# Patient Record
Sex: Female | Born: 1945 | Race: White | Hispanic: No | Marital: Single | Smoking: Never smoker
Health system: Southern US, Community
[De-identification: ages and names within clinical notes are randomized; demographics above are authoritative.]

## PROBLEM LIST (undated history)

## (undated) DIAGNOSIS — E119 Type 2 diabetes mellitus without complications: Secondary | ICD-10-CM

## (undated) DIAGNOSIS — R112 Nausea with vomiting, unspecified: Secondary | ICD-10-CM

## (undated) DIAGNOSIS — K219 Gastro-esophageal reflux disease without esophagitis: Secondary | ICD-10-CM

## (undated) DIAGNOSIS — E785 Hyperlipidemia, unspecified: Secondary | ICD-10-CM

## (undated) DIAGNOSIS — Z9889 Other specified postprocedural states: Secondary | ICD-10-CM

## (undated) DIAGNOSIS — I10 Essential (primary) hypertension: Secondary | ICD-10-CM

## (undated) HISTORY — PX: DILATION AND CURETTAGE OF UTERUS: SHX78

## (undated) HISTORY — PX: EYE SURGERY: SHX253

---

## 2013-08-24 ENCOUNTER — Other Ambulatory Visit: Payer: Self-pay | Admitting: Orthopaedic Surgery

## 2013-08-31 ENCOUNTER — Encounter (HOSPITAL_COMMUNITY): Payer: Self-pay

## 2013-09-02 ENCOUNTER — Encounter (HOSPITAL_COMMUNITY)
Admission: RE | Admit: 2013-09-02 | Discharge: 2013-09-02 | Disposition: A | Discharge status: Home / Self Care | Payer: Medicare Other | Source: Ambulatory Visit | Attending: Orthopaedic Surgery | Admitting: Orthopaedic Surgery

## 2013-09-02 ENCOUNTER — Encounter (HOSPITAL_COMMUNITY): Payer: Self-pay

## 2013-09-02 DIAGNOSIS — Z01812 Encounter for preprocedural laboratory examination: Secondary | ICD-10-CM | POA: Insufficient documentation

## 2013-09-02 DIAGNOSIS — Z01818 Encounter for other preprocedural examination: Secondary | ICD-10-CM | POA: Insufficient documentation

## 2013-09-02 HISTORY — DX: Type 2 diabetes mellitus without complications: E11.9

## 2013-09-02 HISTORY — DX: Hyperlipidemia, unspecified: E78.5

## 2013-09-02 HISTORY — DX: Other specified postprocedural states: Z98.890

## 2013-09-02 HISTORY — DX: Essential (primary) hypertension: I10

## 2013-09-02 HISTORY — DX: Gastro-esophageal reflux disease without esophagitis: K21.9

## 2013-09-02 HISTORY — DX: Nausea with vomiting, unspecified: R11.2

## 2013-09-02 LAB — URINALYSIS, ROUTINE W REFLEX MICROSCOPIC
Bilirubin Urine: NEGATIVE
Glucose, UA: NEGATIVE mg/dL
HGB URINE DIPSTICK: NEGATIVE
Ketones, ur: NEGATIVE mg/dL
Leukocytes, UA: NEGATIVE
Nitrite: NEGATIVE
Protein, ur: NEGATIVE mg/dL
SPECIFIC GRAVITY, URINE: 1.012 (ref 1.005–1.030)
Urobilinogen, UA: 0.2 mg/dL (ref 0.0–1.0)
pH: 5.5 (ref 5.0–8.0)

## 2013-09-02 LAB — CBC WITH DIFFERENTIAL/PLATELET
BASOS ABS: 0 10*3/uL (ref 0.0–0.1)
Basophils Relative: 0 % (ref 0–1)
EOS PCT: 3 % (ref 0–5)
Eosinophils Absolute: 0.2 10*3/uL (ref 0.0–0.7)
HCT: 43 % (ref 36.0–46.0)
Hemoglobin: 14.6 g/dL (ref 12.0–15.0)
LYMPHS PCT: 36 % (ref 12–46)
Lymphs Abs: 2.1 10*3/uL (ref 0.7–4.0)
MCH: 30.3 pg (ref 26.0–34.0)
MCHC: 34 g/dL (ref 30.0–36.0)
MCV: 89.2 fL (ref 78.0–100.0)
Monocytes Absolute: 0.5 10*3/uL (ref 0.1–1.0)
Monocytes Relative: 9 % (ref 3–12)
NEUTROS PCT: 51 % (ref 43–77)
Neutro Abs: 2.9 10*3/uL (ref 1.7–7.7)
PLATELETS: 238 10*3/uL (ref 150–400)
RBC: 4.82 MIL/uL (ref 3.87–5.11)
RDW: 12.1 % (ref 11.5–15.5)
WBC: 5.8 10*3/uL (ref 4.0–10.5)

## 2013-09-02 LAB — SURGICAL PCR SCREEN
MRSA, PCR: NEGATIVE
STAPHYLOCOCCUS AUREUS: NEGATIVE

## 2013-09-02 LAB — ABO/RH: ABO/RH(D): O NEG

## 2013-09-02 LAB — PROTIME-INR
INR: 0.93 (ref 0.00–1.49)
Prothrombin Time: 12.3 seconds (ref 11.6–15.2)

## 2013-09-02 LAB — BASIC METABOLIC PANEL
BUN: 23 mg/dL (ref 6–23)
CALCIUM: 10.5 mg/dL (ref 8.4–10.5)
CO2: 24 mEq/L (ref 19–32)
CREATININE: 0.89 mg/dL (ref 0.50–1.10)
Chloride: 96 mEq/L (ref 96–112)
GFR calc Af Amer: 75 mL/min — ABNORMAL LOW (ref >90)
GFR, EST NON AFRICAN AMERICAN: 65 mL/min — AB (ref >90)
Glucose, Bld: 115 mg/dL — ABNORMAL HIGH (ref 70–99)
Potassium: 3.9 mEq/L (ref 3.7–5.3)
Sodium: 136 mEq/L — ABNORMAL LOW (ref 137–147)

## 2013-09-02 LAB — TYPE AND SCREEN
ABO/RH(D): O NEG
Antibody Screen: NEGATIVE

## 2013-09-02 LAB — APTT: APTT: 29 s (ref 24–37)

## 2013-09-02 MED ORDER — CHLORHEXIDINE GLUCONATE 4 % EX LIQD: 60.0000 mL | Freq: Once | CUTANEOUS | Status: DC

## 2013-09-02 NOTE — Pre-Procedure Instructions (Signed)
[name]  09/02/2013   Your procedure is scheduled on:  Tuesday  09/07/13  [facility]  at 730 AM.  Call this number if you have problems the morning of surgery: [phone]   Remember:   Do not eat food or drink liquids after midnight.   Take these medicines the morning of surgery with A SIP OF WATER:  ZYRTEC, ESTRACE, PROVERA, ULTRAM IF NEEDED    STOP ASPIRIN   Do not wear jewelry, make-up or nail polish.  Do not wear lotions, powders, or perfumes. You may wear deodorant.  Do not shave 48 hours prior to surgery. Men may shave face and neck.  Do not bring valuables to the hospital.  [facility] not [facility]  for any belongings or valuables.               Contacts, dentures or bridgework may not be worn into surgery.  Leave suitcase in the car. After surgery it may be brought to your room.  For patients admitted to the hospital, discharge time is determined by your treatment team.               Patients discharged the day of surgery will not be allowed to drive home.  Name and phone number of your driver:   Special Instructions: Shower using CHG 2 nights before surgery and the night before surgery.  If you shower the day of surgery use CHG.  Use special wash - you have one bottle of CHG for all showers.  You should use approximately 1/3 of the bottle for each shower.   Please read over the following fact sheets that you were given: Pain Booklet, Coughing and Deep Breathing, Blood Transfusion Information and Surgical Site Infection Prevention

## 2013-09-03 NOTE — H&P (Signed)
TOTAL KNEE ADMISSION H&P  Patient is being admitted for right total knee arthroplasty.  Subjective:  Chief Complaint:right knee pain.  HPI: Rhonda Gomez, 68 y.o. female, has a history of pain and functional disability in the right knee due to arthritis and has failed non-surgical conservative treatments for greater than 12 weeks to includeNSAID's and/or analgesics, corticosteriod injections, viscosupplementation injections, flexibility and strengthening excercises, supervised PT with diminished ADL's post treatment, weight reduction as appropriate and activity modification.  Onset of symptoms was gradual, starting 10 years ago with gradually worsening course since that time. The patient noted no past surgery on the right knee(s).  Patient currently rates pain in the right knee(s) at 9 out of 10 with activity. Patient has night pain, worsening of pain with activity and weight bearing, pain that interferes with activities of daily living, pain with passive range of motion and crepitus.  Patient has evidence of subchondral sclerosis, periarticular osteophytes and joint space narrowing by imaging studies. This patient has had no previous surgery.. There is no active infection.  There are no active problems to display for this patient.  Past Medical History  Diagnosis Date  . PONV (postoperative nausea and vomiting)   . Hypertension   . Diabetes mellitus without complication   . GERD (gastroesophageal reflux disease)   . Hyperlipidemia     Past Surgical History  Procedure Laterality Date  . Dilation and curettage of uterus    . Eye surgery Bilateral     cataracts    No prescriptions prior to admission   Allergies  Allergen Reactions  . Codeine Nausea And Vomiting    History  Substance Use Topics  . Smoking status: Never Smoker   . Smokeless tobacco: Not on file  . Alcohol Use: No    No family history on file.   Review of Systems  Constitutional: Negative.   HENT: Negative.    Eyes: Negative.   Respiratory: Negative.   Cardiovascular: Negative.   Gastrointestinal: Negative.   Genitourinary: Negative.   Musculoskeletal: Positive for joint pain.  Skin: Negative.   Neurological: Negative.   Endo/Heme/Allergies: Negative.   Psychiatric/Behavioral: Negative.     Objective:  Physical Exam  Constitutional: She appears well-developed.  HENT:  Head: Normocephalic.  Eyes: Pupils are equal, round, and reactive to light.  Neck: Normal range of motion.  Cardiovascular: Normal rate.   Respiratory: Effort normal.  GI: Soft.  Musculoskeletal:  Right knee exam: No effusion.  Crepitation 1+.  Range of motion 0-100.  Medial and lateral joint line pain to palpation.  Ligaments stable.  Neurological: She is alert.  Skin: Skin is warm.  Psychiatric: She has a normal mood and affect.    Vital signs in last 24 hours:    Labs:   There is no height or weight on file to calculate BMI.   Imaging Review Plain radiographs demonstrate severe degenerative joint disease of the right knee(s). The overall alignment isneutral. The bone quality appears to be good for age and reported activity level.  Assessment/Plan:  End stage arthritis, right knee   The patient history, physical examination, clinical judgment of the provider and imaging studies are consistent with end stage degenerative joint disease of the right knee(s) and total knee arthroplasty is deemed medically necessary. The treatment options including medical management, injection therapy arthroscopy and arthroplasty were discussed at length. The risks and benefits of total knee arthroplasty were presented and reviewed. The risks due to aseptic loosening, infection, stiffness, patella tracking problems, thromboembolic  complications and other imponderables were discussed. The patient acknowledged the explanation, agreed to proceed with the plan and consent was signed. Patient is being admitted for inpatient treatment  for surgery, pain control, PT, OT, prophylactic antibiotics, VTE prophylaxis, progressive ambulation and ADL's and discharge planning. The patient is planning to be discharged to skilled nursing facility

## 2013-09-06 MED ORDER — CEFAZOLIN SODIUM-DEXTROSE 2-3 GM-% IV SOLR
2.0000 g | INTRAVENOUS | Status: AC
  Administered 2013-09-07: 2 g via INTRAVENOUS

## 2013-09-07 ENCOUNTER — Encounter (HOSPITAL_COMMUNITY): Payer: Medicare Other | Admitting: Anesthesiology

## 2013-09-07 ENCOUNTER — Inpatient Hospital Stay (HOSPITAL_COMMUNITY)
Admission: RE | Admit: 2013-09-07 | Discharge: 2013-09-09 | DRG: 470 | Disposition: A | Discharge status: Skilled Nursing Facility | Payer: Medicare Other | Source: Ambulatory Visit | Attending: Orthopaedic Surgery | Admitting: Orthopaedic Surgery

## 2013-09-07 ENCOUNTER — Inpatient Hospital Stay (HOSPITAL_COMMUNITY): Payer: Medicare Other | Admitting: Anesthesiology

## 2013-09-07 ENCOUNTER — Encounter (HOSPITAL_COMMUNITY): Payer: Self-pay | Admitting: *Deleted

## 2013-09-07 ENCOUNTER — Encounter (HOSPITAL_COMMUNITY)
Admission: RE | Disposition: A | Discharge status: Skilled Nursing Facility | Payer: Self-pay | Source: Ambulatory Visit | Attending: Orthopaedic Surgery

## 2013-09-07 DIAGNOSIS — E119 Type 2 diabetes mellitus without complications: Secondary | ICD-10-CM | POA: Diagnosis present

## 2013-09-07 DIAGNOSIS — K219 Gastro-esophageal reflux disease without esophagitis: Secondary | ICD-10-CM | POA: Diagnosis present

## 2013-09-07 DIAGNOSIS — M1710 Unilateral primary osteoarthritis, unspecified knee: Principal | ICD-10-CM | POA: Diagnosis present

## 2013-09-07 DIAGNOSIS — Z96651 Presence of right artificial knee joint: Secondary | ICD-10-CM

## 2013-09-07 DIAGNOSIS — Z79899 Other long term (current) drug therapy: Secondary | ICD-10-CM

## 2013-09-07 DIAGNOSIS — E785 Hyperlipidemia, unspecified: Secondary | ICD-10-CM | POA: Diagnosis present

## 2013-09-07 DIAGNOSIS — Z7982 Long term (current) use of aspirin: Secondary | ICD-10-CM

## 2013-09-07 DIAGNOSIS — M1711 Unilateral primary osteoarthritis, right knee: Secondary | ICD-10-CM

## 2013-09-07 DIAGNOSIS — I10 Essential (primary) hypertension: Secondary | ICD-10-CM | POA: Diagnosis present

## 2013-09-07 HISTORY — PX: TOTAL KNEE ARTHROPLASTY: SHX125

## 2013-09-07 LAB — GLUCOSE, CAPILLARY
Glucose-Capillary: 121 mg/dL — ABNORMAL HIGH (ref 70–99)
Glucose-Capillary: 101 mg/dL — ABNORMAL HIGH (ref 70–99)

## 2013-09-07 SURGERY — ARTHROPLASTY, KNEE, TOTAL
Anesthesia: Regional | Laterality: Right

## 2013-09-07 MED ORDER — PROMETHAZINE HCL 25 MG/ML IJ SOLN: 12.5000 mg | Freq: Four times a day (QID) | INTRAMUSCULAR | Status: DC | PRN

## 2013-09-07 MED ORDER — FAMOTIDINE 40 MG PO TABS
40.0000 mg | ORAL_TABLET | Freq: Every day | ORAL | Status: DC
  Administered 2013-09-07 – 2013-09-08 (×2): 40 mg via ORAL
  Filled 2013-09-07 (×4): qty 1

## 2013-09-07 MED ORDER — BUPIVACAINE LIPOSOME 1.3 % IJ SUSP
20.0000 mL | INTRAMUSCULAR | Status: AC
  Filled 2013-09-07: qty 20

## 2013-09-07 MED ORDER — HYDROMORPHONE HCL PF 1 MG/ML IJ SOLN
1.0000 mg | INTRAMUSCULAR | Status: DC | PRN
  Administered 2013-09-07: 0.5 mg via INTRAVENOUS
  Filled 2013-09-07: qty 1

## 2013-09-07 MED ORDER — PROMETHAZINE HCL 25 MG/ML IJ SOLN
12.5000 mg | Freq: Four times a day (QID) | INTRAMUSCULAR | Status: DC | PRN
  Administered 2013-09-08 – 2013-09-09 (×2): 12.5 mg via INTRAVENOUS
  Filled 2013-09-07 (×2): qty 1

## 2013-09-07 MED ORDER — ONDANSETRON HCL 4 MG PO TABS: 4.0000 mg | ORAL_TABLET | Freq: Four times a day (QID) | ORAL | Status: DC | PRN

## 2013-09-07 MED ORDER — FENTANYL CITRATE 0.05 MG/ML IJ SOLN
INTRAMUSCULAR | Status: DC | PRN
  Administered 2013-09-07 (×3): 50 ug via INTRAVENOUS

## 2013-09-07 MED ORDER — METOCLOPRAMIDE HCL 5 MG/ML IJ SOLN
5.0000 mg | Freq: Three times a day (TID) | INTRAMUSCULAR | Status: DC | PRN
  Administered 2013-09-07: 10 mg via INTRAVENOUS
  Filled 2013-09-07: qty 2

## 2013-09-07 MED ORDER — LACTATED RINGERS IV SOLN
INTRAVENOUS | Status: DC
  Administered 2013-09-07: 08:00:00 via INTRAVENOUS

## 2013-09-07 MED ORDER — OXYCODONE HCL 5 MG PO TABS
5.0000 mg | ORAL_TABLET | ORAL | Status: DC | PRN
  Filled 2013-09-07: qty 2

## 2013-09-07 MED ORDER — LORATADINE 10 MG PO TABS
10.0000 mg | ORAL_TABLET | Freq: Every day | ORAL | Status: DC
  Administered 2013-09-08 – 2013-09-09 (×2): 10 mg via ORAL
  Filled 2013-09-07 (×2): qty 1

## 2013-09-07 MED ORDER — ALUM & MAG HYDROXIDE-SIMETH 200-200-20 MG/5ML PO SUSP: 30.0000 mL | ORAL | Status: DC | PRN

## 2013-09-07 MED ORDER — LORATADINE 10 MG PO TABS: 10.0000 mg | ORAL_TABLET | Freq: Every day | ORAL | Status: DC

## 2013-09-07 MED ORDER — ACETAMINOPHEN 325 MG PO TABS
650.0000 mg | ORAL_TABLET | Freq: Four times a day (QID) | ORAL | Status: DC | PRN
  Administered 2013-09-07 – 2013-09-08 (×3): 650 mg via ORAL
  Filled 2013-09-07 (×3): qty 2

## 2013-09-07 MED ORDER — LACTATED RINGERS IV SOLN
INTRAVENOUS | Status: DC
  Administered 2013-09-07 – 2013-09-08 (×2): via INTRAVENOUS

## 2013-09-07 MED ORDER — MIDAZOLAM HCL 2 MG/2ML IJ SOLN
INTRAMUSCULAR | Status: AC
  Administered 2013-09-07 (×2): 1 mg
  Filled 2013-09-07: qty 2

## 2013-09-07 MED ORDER — FENOFIBRATE 160 MG PO TABS
160.0000 mg | ORAL_TABLET | Freq: Every day | ORAL | Status: DC
Start: 2013-09-08 — End: 2013-09-09
  Administered 2013-09-08 – 2013-09-09 (×2): 160 mg via ORAL
  Filled 2013-09-07 (×2): qty 1

## 2013-09-07 MED ORDER — SIMVASTATIN 20 MG PO TABS
20.0000 mg | ORAL_TABLET | Freq: Every day | ORAL | Status: DC
  Administered 2013-09-07 – 2013-09-08 (×2): 20 mg via ORAL
  Filled 2013-09-07 (×4): qty 1

## 2013-09-07 MED ORDER — ASPIRIN EC 325 MG PO TBEC
325.0000 mg | DELAYED_RELEASE_TABLET | Freq: Two times a day (BID) | ORAL | Status: DC
  Administered 2013-09-08 – 2013-09-09 (×4): 325 mg via ORAL
  Filled 2013-09-07 (×6): qty 1

## 2013-09-07 MED ORDER — LACTATED RINGERS IV SOLN: INTRAVENOUS | Status: DC

## 2013-09-07 MED ORDER — PHENOL 1.4 % MT LIQD: 1.0000 | OROMUCOSAL | Status: DC | PRN

## 2013-09-07 MED ORDER — PROPOFOL 10 MG/ML IV BOLUS
INTRAVENOUS | Status: DC | PRN
  Administered 2013-09-07: 40 mg via INTRAVENOUS

## 2013-09-07 MED ORDER — MENTHOL 3 MG MT LOZG: 1.0000 | LOZENGE | OROMUCOSAL | Status: DC | PRN

## 2013-09-07 MED ORDER — OXYCODONE HCL 5 MG PO TABS
5.0000 mg | ORAL_TABLET | Freq: Once | ORAL | Status: AC | PRN
  Administered 2013-09-07: 5 mg via ORAL

## 2013-09-07 MED ORDER — MEDROXYPROGESTERONE ACETATE 2.5 MG PO TABS
2.5000 mg | ORAL_TABLET | Freq: Every day | ORAL | Status: DC
  Administered 2013-09-08 – 2013-09-09 (×2): 2.5 mg via ORAL
  Filled 2013-09-07 (×2): qty 1

## 2013-09-07 MED ORDER — BUPIVACAINE IN DEXTROSE 0.75-8.25 % IT SOLN
INTRATHECAL | Status: DC | PRN
  Administered 2013-09-07: 1.8 mL via INTRATHECAL

## 2013-09-07 MED ORDER — ACETAMINOPHEN 650 MG RE SUPP: 650.0000 mg | Freq: Four times a day (QID) | RECTAL | Status: DC | PRN

## 2013-09-07 MED ORDER — LACTATED RINGERS IV SOLN
INTRAVENOUS | Status: DC | PRN
  Administered 2013-09-07 (×2): via INTRAVENOUS

## 2013-09-07 MED ORDER — PROPOFOL INFUSION 10 MG/ML OPTIME
INTRAVENOUS | Status: DC | PRN
  Administered 2013-09-07: 120 ug/kg/min via INTRAVENOUS

## 2013-09-07 MED ORDER — BUPIVACAINE LIPOSOME 1.3 % IJ SUSP
20.0000 mL | Freq: Once | INTRAMUSCULAR | Status: AC
  Administered 2013-09-07: 20 mL
  Filled 2013-09-07: qty 20

## 2013-09-07 MED ORDER — ESTRADIOL 1 MG PO TABS
0.5000 mg | ORAL_TABLET | Freq: Every day | ORAL | Status: DC
  Administered 2013-09-08 – 2013-09-09 (×2): 0.5 mg via ORAL
  Filled 2013-09-07 (×4): qty 0.5

## 2013-09-07 MED ORDER — LOSARTAN POTASSIUM 50 MG PO TABS
100.0000 mg | ORAL_TABLET | Freq: Every day | ORAL | Status: DC
  Administered 2013-09-09: 100 mg via ORAL
  Filled 2013-09-07 (×2): qty 2

## 2013-09-07 MED ORDER — ONDANSETRON HCL 4 MG/2ML IJ SOLN
4.0000 mg | Freq: Four times a day (QID) | INTRAMUSCULAR | Status: DC | PRN
  Administered 2013-09-07: 4 mg via INTRAVENOUS
  Filled 2013-09-07: qty 2

## 2013-09-07 MED ORDER — OXYCODONE HCL 5 MG/5ML PO SOLN: 5.0000 mg | Freq: Once | ORAL | Status: AC | PRN

## 2013-09-07 MED ORDER — HYDROMORPHONE HCL PF 1 MG/ML IJ SOLN
INTRAMUSCULAR | Status: AC
Start: 2013-09-07 — End: 2013-09-08
  Filled 2013-09-07: qty 1

## 2013-09-07 MED ORDER — FENTANYL CITRATE 0.05 MG/ML IJ SOLN
INTRAMUSCULAR | Status: AC
  Administered 2013-09-07 (×2): 50 ug
  Filled 2013-09-07: qty 2

## 2013-09-07 MED ORDER — SODIUM CHLORIDE 0.9 % IJ SOLN
INTRAMUSCULAR | Status: AC
  Filled 2013-09-07: qty 20

## 2013-09-07 MED ORDER — SODIUM CHLORIDE 0.9 % IV SOLN
1000.0000 mg | INTRAVENOUS | Status: AC
  Administered 2013-09-07: 1000 mg via INTRAVENOUS
  Filled 2013-09-07: qty 10

## 2013-09-07 MED ORDER — ONDANSETRON HCL 4 MG/2ML IJ SOLN
4.0000 mg | Freq: Once | INTRAMUSCULAR | Status: AC | PRN
  Administered 2013-09-07: 4 mg via INTRAVENOUS

## 2013-09-07 MED ORDER — PROMETHAZINE HCL 25 MG/ML IJ SOLN
INTRAMUSCULAR | Status: AC
  Administered 2013-09-07: 6.25 mg
  Filled 2013-09-07: qty 1

## 2013-09-07 MED ORDER — ACETAMINOPHEN 10 MG/ML IV SOLN
1000.0000 mg | INTRAVENOUS | Status: AC
  Administered 2013-09-07: 1000 mg via INTRAVENOUS
  Filled 2013-09-07: qty 100

## 2013-09-07 MED ORDER — FLUTICASONE PROPIONATE 50 MCG/ACT NA SUSP
2.0000 | Freq: Every day | NASAL | Status: DC
  Administered 2013-09-08 – 2013-09-09 (×2): 2 via NASAL
  Filled 2013-09-07: qty 16

## 2013-09-07 MED ORDER — HYDROCHLOROTHIAZIDE 25 MG PO TABS
25.0000 mg | ORAL_TABLET | Freq: Every day | ORAL | Status: DC
  Administered 2013-09-09: 25 mg via ORAL
  Filled 2013-09-07 (×2): qty 1

## 2013-09-07 MED ORDER — METOCLOPRAMIDE HCL 10 MG PO TABS: 5.0000 mg | ORAL_TABLET | Freq: Three times a day (TID) | ORAL | Status: DC | PRN

## 2013-09-07 MED ORDER — TRAMADOL HCL 50 MG PO TABS
50.0000 mg | ORAL_TABLET | Freq: Four times a day (QID) | ORAL | Status: DC | PRN
  Administered 2013-09-07 – 2013-09-09 (×6): 100 mg via ORAL
  Filled 2013-09-07 (×7): qty 2

## 2013-09-07 MED ORDER — METFORMIN HCL 500 MG PO TABS
500.0000 mg | ORAL_TABLET | Freq: Two times a day (BID) | ORAL | Status: DC
  Administered 2013-09-08 – 2013-09-09 (×4): 500 mg via ORAL
  Filled 2013-09-07 (×6): qty 1

## 2013-09-07 MED ORDER — MORPHINE SULFATE 2 MG/ML IJ SOLN
1.0000 mg | INTRAMUSCULAR | Status: DC | PRN
  Administered 2013-09-08: 2 mg via INTRAVENOUS
  Administered 2013-09-08: 1 mg via INTRAVENOUS
  Administered 2013-09-08: 2 mg via INTRAVENOUS
  Filled 2013-09-07 (×3): qty 1

## 2013-09-07 MED ORDER — OXYCODONE HCL 5 MG PO TABS
ORAL_TABLET | ORAL | Status: AC
  Filled 2013-09-07: qty 1

## 2013-09-07 MED ORDER — ONDANSETRON HCL 4 MG/2ML IJ SOLN
INTRAMUSCULAR | Status: AC
  Filled 2013-09-07: qty 2

## 2013-09-07 MED ORDER — SODIUM CHLORIDE 0.9 % IR SOLN
Status: DC | PRN
  Administered 2013-09-07: 3000 mL

## 2013-09-07 MED ORDER — CEFAZOLIN SODIUM-DEXTROSE 2-3 GM-% IV SOLR
2.0000 g | Freq: Four times a day (QID) | INTRAVENOUS | Status: AC
  Administered 2013-09-07 (×2): 2 g via INTRAVENOUS
  Filled 2013-09-07 (×2): qty 50

## 2013-09-07 MED ORDER — HYDROMORPHONE HCL PF 1 MG/ML IJ SOLN
0.2500 mg | INTRAMUSCULAR | Status: DC | PRN
  Administered 2013-09-07: 0.5 mg via INTRAVENOUS

## 2013-09-07 MED ORDER — ONDANSETRON HCL 4 MG/2ML IJ SOLN
INTRAMUSCULAR | Status: DC | PRN
  Administered 2013-09-07: 4 mg via INTRAVENOUS

## 2013-09-07 SURGICAL SUPPLY — 75 items
BANDAGE ELASTIC 4 VELCRO ST LF (GAUZE/BANDAGES/DRESSINGS) ×3 IMPLANT
BANDAGE ELASTIC 6 VELCRO ST LF (GAUZE/BANDAGES/DRESSINGS) ×3 IMPLANT
BANDAGE ESMARK 6X9 LF (GAUZE/BANDAGES/DRESSINGS) ×1 IMPLANT
BANDAGE GAUZE ELAST BULKY 4 IN (GAUZE/BANDAGES/DRESSINGS) ×6 IMPLANT
BLADE SAGITTAL 25.0X1.19X90 (BLADE) ×2 IMPLANT
BLADE SAGITTAL 25.0X1.19X90MM (BLADE) ×1
BLADE SURG ROTATE 9660 (MISCELLANEOUS) IMPLANT
BNDG ELASTIC 6X10 VLCR STRL LF (GAUZE/BANDAGES/DRESSINGS) ×3 IMPLANT
BNDG ESMARK 6X9 LF (GAUZE/BANDAGES/DRESSINGS) ×3
BNDG GAUZE ELAST 4 BULKY (GAUZE/BANDAGES/DRESSINGS) ×3 IMPLANT
BOWL SMART MIX CTS (DISPOSABLE) ×3 IMPLANT
CAPT RP KNEE ×3 IMPLANT
CEMENT HV SMART SET (Cement) ×6 IMPLANT
CLOSURE WOUND 1/2 X4 (GAUZE/BANDAGES/DRESSINGS) ×1
CLOTH BEACON ORANGE TIMEOUT ST (SAFETY) ×3 IMPLANT
COVER SURGICAL LIGHT HANDLE (MISCELLANEOUS) ×3 IMPLANT
CUFF TOURNIQUET SINGLE 34IN LL (TOURNIQUET CUFF) ×3 IMPLANT
CUFF TOURNIQUET SINGLE 44IN (TOURNIQUET CUFF) IMPLANT
DRAPE EXTREMITY T 121X128X90 (DRAPE) ×3 IMPLANT
DRAPE PROXIMA HALF (DRAPES) ×3 IMPLANT
DRAPE U-SHAPE 47X51 STRL (DRAPES) ×3 IMPLANT
DRSG ADAPTIC 3X8 NADH LF (GAUZE/BANDAGES/DRESSINGS) ×3 IMPLANT
DRSG PAD ABDOMINAL 8X10 ST (GAUZE/BANDAGES/DRESSINGS) ×3 IMPLANT
DURAPREP 26ML APPLICATOR (WOUND CARE) ×3 IMPLANT
ELECT REM PT RETURN 9FT ADLT (ELECTROSURGICAL) ×3
ELECTRODE REM PT RTRN 9FT ADLT (ELECTROSURGICAL) ×1 IMPLANT
FACESHIELD LNG OPTICON STERILE (SAFETY) ×6 IMPLANT
GLOVE BIO SURGEON STRL SZ7 (GLOVE) ×3 IMPLANT
GLOVE BIO SURGEON STRL SZ8.5 (GLOVE) ×3 IMPLANT
GLOVE BIOGEL PI IND STRL 7.0 (GLOVE) ×3 IMPLANT
GLOVE BIOGEL PI IND STRL 8 (GLOVE) ×1 IMPLANT
GLOVE BIOGEL PI IND STRL 8.5 (GLOVE) ×1 IMPLANT
GLOVE BIOGEL PI INDICATOR 7.0 (GLOVE) ×6
GLOVE BIOGEL PI INDICATOR 8 (GLOVE) ×2
GLOVE BIOGEL PI INDICATOR 8.5 (GLOVE) ×2
GLOVE SS BIOGEL STRL SZ 8 (GLOVE) ×1 IMPLANT
GLOVE SUPERSENSE BIOGEL SZ 8 (GLOVE) ×2
GOWN PREVENTION PLUS XLARGE (GOWN DISPOSABLE) ×6 IMPLANT
GOWN STRL NON-REIN LRG LVL3 (GOWN DISPOSABLE) ×9 IMPLANT
GOWN STRL REUS W/TWL 2XL LVL3 (GOWN DISPOSABLE) ×3 IMPLANT
HANDPIECE INTERPULSE COAX TIP (DISPOSABLE) ×2
HOOD PEEL AWAY FACE SHEILD DIS (HOOD) ×6 IMPLANT
IMMOBILIZER KNEE 20 (SOFTGOODS)
IMMOBILIZER KNEE 20 THIGH 36 (SOFTGOODS) IMPLANT
IMMOBILIZER KNEE 22  40 CIR (ORTHOPEDIC SUPPLIES) ×2
IMMOBILIZER KNEE 22 40 CIR (ORTHOPEDIC SUPPLIES) ×1 IMPLANT
IMMOBILIZER KNEE 22 UNIV (SOFTGOODS) ×3 IMPLANT
IMMOBILIZER KNEE 24 THIGH 36 (MISCELLANEOUS) IMPLANT
IMMOBILIZER KNEE 24 UNIV (MISCELLANEOUS)
KIT BASIN OR (CUSTOM PROCEDURE TRAY) ×3 IMPLANT
KIT ROOM TURNOVER OR (KITS) ×3 IMPLANT
MANIFOLD NEPTUNE II (INSTRUMENTS) ×3 IMPLANT
NEEDLE HYPO 21X1 ECLIPSE (NEEDLE) ×3 IMPLANT
NS IRRIG 1000ML POUR BTL (IV SOLUTION) ×3 IMPLANT
PACK TOTAL JOINT (CUSTOM PROCEDURE TRAY) ×3 IMPLANT
PAD ARMBOARD 7.5X6 YLW CONV (MISCELLANEOUS) ×6 IMPLANT
SET HNDPC FAN SPRY TIP SCT (DISPOSABLE) ×1 IMPLANT
SPONGE GAUZE 4X4 12PLY (GAUZE/BANDAGES/DRESSINGS) ×3 IMPLANT
STAPLER VISISTAT 35W (STAPLE) IMPLANT
STRIP CLOSURE SKIN 1/2X4 (GAUZE/BANDAGES/DRESSINGS) ×2 IMPLANT
SUCTION FRAZIER TIP 10 FR DISP (SUCTIONS) IMPLANT
SUT MNCRL AB 3-0 PS2 18 (SUTURE) ×3 IMPLANT
SUT VIC AB 0 CT1 27 (SUTURE) ×4
SUT VIC AB 0 CT1 27XBRD ANBCTR (SUTURE) ×2 IMPLANT
SUT VIC AB 2-0 CT1 27 (SUTURE) ×4
SUT VIC AB 2-0 CT1 TAPERPNT 27 (SUTURE) ×2 IMPLANT
SUT VLOC 180 0 24IN GS25 (SUTURE) ×3 IMPLANT
SYR 50ML LL SCALE MARK (SYRINGE) ×3 IMPLANT
TOWEL OR 17X24 6PK STRL BLUE (TOWEL DISPOSABLE) ×3 IMPLANT
TOWEL OR 17X26 10 PK STRL BLUE (TOWEL DISPOSABLE) ×3 IMPLANT
TRAY FOLEY CATH 14FR (SET/KITS/TRAYS/PACK) ×3 IMPLANT
TUBE CONNECTING 12'X1/4 (SUCTIONS) ×1
TUBE CONNECTING 12X1/4 (SUCTIONS) ×2 IMPLANT
WATER STERILE IRR 1000ML POUR (IV SOLUTION) ×6 IMPLANT
YANKAUER SUCT BULB TIP NO VENT (SUCTIONS) ×3 IMPLANT

## 2013-09-07 NOTE — Transfer of Care (Signed)
Immediate Anesthesia Transfer of Care Note  Patient: Rhonda MusselKathleen E Seith  Procedure(s) Performed: Procedure(s): TOTAL KNEE ARTHROPLASTY (Right)  Patient Location: PACU  Anesthesia Type:MAC, Regional and Spinal  Level of Consciousness: awake, alert  and oriented  Airway & Oxygen Therapy: Patient Spontanous Breathing and Patient connected to nasal cannula oxygen  Post-op Assessment: Report given to PACU RN and Post -op Vital signs reviewed and stable  Post vital signs: Reviewed and stable  Complications: No apparent anesthesia complications

## 2013-09-07 NOTE — Progress Notes (Signed)
Lunch relief by Grandville Silosmaria T. RN

## 2013-09-07 NOTE — Progress Notes (Signed)
Utilization review completed.  

## 2013-09-07 NOTE — Interval H&P Note (Signed)
History and Physical Interval Note:  09/07/2013 9:21 AM  Rhonda MusselKathleen E Hoogendoorn  has presented today for surgery, with the diagnosis of RIGHT KNEE DEGENERATIVE JOINT DISEASE  The various methods of treatment have been discussed with the patient and family. After consideration of risks, benefits and other options for treatment, the patient has consented to  Procedure(s): TOTAL KNEE ARTHROPLASTY (Right) as a surgical intervention .  The patient's history has been reviewed, patient examined, no change in status, stable for surgery.  I have reviewed the patient's chart and labs.  Questions were answered to the patient's satisfaction.     Cleon Thoma G

## 2013-09-07 NOTE — Op Note (Signed)
PREOP DIAGNOSIS: DJD RIGHT KNEE POSTOP DIAGNOSIS: same PROCEDURE: RIGHT TKR ANESTHESIA: Spinal and block ATTENDING SURGEON: Cherylee Rawlinson G ASSISTANT: Dirk Dress OPA and Patrick Jupiter RNFA  INDICATIONS FOR PROCEDURE: Rhonda Gomez is a 68 y.o. female who has struggled for a long time with pain due to degenerative arthritis of the right knee.  The patient has failed many conservative non-operative measures and at this point has pain which limits the ability to sleep and walk.  The patient is offered total knee replacement.  Informed operative consent was obtained after discussion of possible risks of anesthesia, infection, neurovascular injury, DVT, and death.  The importance of the post-operative rehabilitation protocol to optimize result was stressed extensively with the patient.  SUMMARY OF FINDINGS AND PROCEDURE:  Rhonda Gomez was taken to the operative suite where under the above anesthesia a right knee replacement was performed.  There were advanced degenerative changes and the bone quality was good.  We used the DePuy system and placed size standard femur, 3 tibia, 35 mm all polyethylene patella, and a size 15 mm spacer.  The patient was admitted for appropriate post-op care to include perioperative antibiotics and mechanical and pharmacologic measures for DVT prophylaxis.  DESCRIPTION OF PROCEDURE:  Rhonda Gomez was taken to the operative suite where the above anesthesia was applied.  The patient was positioned supine and prepped and draped in normal sterile fashion.  An appropriate time out was performed.  After the administration of Kefzol pre-op antibiotic the leg was elevated and exsanguinated and a tourniquet inflated. A standard longitudinal incision was made on the anterior knee.  Dissection was carried down to the extensor mechanism.  All appropriate anti-infective measures were used including the pre-operative antibiotic, betadine impregnated drape, and closed hooded  exhaust systems for each member of the surgical team.  A medial parapatellar incision was made in the extensor mechanism and the knee cap flipped and the knee flexed.  Some residual meniscal tissues were removed along with any remaining ACL/PCL tissue.  A guide was placed on the tibia and a flat cut was made on it's superior surface.  An intramedullary guide was placed in the femur and was utilized to make anterior and posterior cuts creating an appropriate flexion gap.  A second intramedullary guide was placed in the femur to make a distal cut properly balancing the knee with an extension gap equal to the flexion gap.  The three bones sized to the above mentioned sizes and the appropriate guides were placed and utilized.  A trial reduction was done and the knee easily came to full extension and the patella tracked well on flexion.  The trial components were removed and all bones were cleaned with pulsatile lavage and then dried thoroughly.  Cement was mixed and was pressurized onto the bones followed by placement of the aforementioned components.  Excess cement was trimmed and pressure was held on the components until the cement had hardened.  The tourniquet was deflated and a small amount of bleeding was controlled with cautery and pressure.  The knee was irrigated thoroughly.  The extensor mechanism was re-approximated with V-loc suture in running fashion.  The knee was flexed and the repair was solid.  The subcutaneous tissues were re-approximated with #0 and #2-0 vicryl and the skin closed with a subcuticular stitch and steristrips.  A sterile dressing was applied.  Intraoperative fluids, EBL, and tourniquet time can be obtained from anesthesia records.  DISPOSITION:  The patient was taken to recovery room  in stable condition and admitted for appropriate post-op care to include peri-operative antibiotic and DVT prophylaxis with mechanical and pharmacologic measures.  Marisal Swarey G 09/07/2013, 11:23 AM

## 2013-09-07 NOTE — Anesthesia Postprocedure Evaluation (Addendum)
  Anesthesia Post-op Note  Patient: Rhonda Gomez  Procedure(s) Performed: Procedure(s): TOTAL KNEE ARTHROPLASTY (Right)  Patient Location: PACU  Anesthesia Type:Regional and Spinal  Level of Consciousness: awake, alert  and oriented  Airway and Oxygen Therapy: Patient Spontanous Breathing and Patient connected to nasal cannula oxygen  Post-op Pain: none  Post-op Assessment: Post-op Vital signs reviewed, Patient's Cardiovascular Status Stable, Respiratory Function Stable, Patent Airway, No signs of Nausea or vomiting and Pain level controlled  Post-op Vital Signs: Reviewed and stable  Complications: No apparent anesthesia complications

## 2013-09-07 NOTE — Progress Notes (Signed)
Orthopedic Tech Progress Note Patient Details:  Rhonda MusselKathleen E Gomez Jan 05, 1946 [id number] CPM applied to Right LE with appropriate settings. No OHF available at this time for patient use. OHF will be applied as they become available.  CPM Right Knee CPM Right Knee: On Right Knee Flexion (Degrees): 60 Right Knee Extension (Degrees): 0   Asia R Thompson 09/07/2013, 12:47 PM

## 2013-09-07 NOTE — Anesthesia Procedure Notes (Addendum)
Spinal  Patient location during procedure: pre-op Start time: 09/07/2013 9:47 AM End time: 09/07/2013 9:55 AM Staffing Anesthesiologist: Maple HudsonMOSER, CHRIS CRNA/Resident: Whitman HeroVITSHTEYN, ALEX Performed by: resident/CRNA and anesthesiologist  Preanesthetic Checklist Completed: patient identified, site marked, surgical consent, pre-op evaluation, timeout performed, IV checked, risks and benefits discussed and monitors and equipment checked Spinal Block Patient position: sitting Prep: Betadine and site prepped and draped Patient monitoring: heart rate, cardiac monitor and continuous pulse ox Approach: midline Location: L4-5 Injection technique: single-shot Needle Needle type: Quincke  Needle gauge: 25 G Catheter size: 19 g Assessment Sensory level: T6  Anesthesia Regional Block:  Femoral nerve block  Pre-Anesthetic Checklist: ,, timeout performed, Correct Patient, Correct Site, Correct Laterality, Correct Procedure, Correct Position, site marked, Risks and benefits discussed,  Surgical consent,  Pre-op evaluation,  At surgeon's request and post-op pain management  Laterality: Right  Prep: chloraprep       Needles:  Injection technique: Single-shot  Needle Type: Stimiplex          Additional Needles:  Procedures: ultrasound guided (picture in chart) Femoral nerve block Narrative:  Start time: 09/07/2013 9:12 AM End time: 09/07/2013 9:26 AM Injection made incrementally with aspirations every 5 mL.  Performed by: Personally  Anesthesiologist: Maple HudsonMoser

## 2013-09-07 NOTE — Anesthesia Preprocedure Evaluation (Signed)
Anesthesia Evaluation  Patient identified by MRN, date of birth, ID band Patient awake    Reviewed: Allergy & Precautions, H&P , NPO status , Patient's Chart, lab work & pertinent test results  History of Anesthesia Complications (+) PONV  Airway Mallampati: II TM Distance: >3 FB Neck ROM: Full    Dental  (+) Teeth Intact   Pulmonary neg pulmonary ROS,          Cardiovascular Exercise Tolerance: Good hypertension, Pt. on medications - angina- CAD and - Past MI Rhythm:Regular Rate:Normal     Neuro/Psych negative neurological ROS  negative psych ROS   GI/Hepatic Neg liver ROS, GERD-  Controlled and Medicated,  Endo/Other  diabetes, Well Controlled, Oral Hypoglycemic Agents  Renal/GU negative Renal ROS  negative genitourinary   Musculoskeletal   Abdominal   Peds  Hematology negative hematology ROS (+)   Anesthesia Other Findings   Reproductive/Obstetrics                           Anesthesia Physical Anesthesia Plan  ASA: II  Anesthesia Plan: Regional and Spinal   Post-op Pain Management: MAC Combined w/ Regional for Post-op pain   Induction:   Airway Management Planned: Mask  Additional Equipment: None  Intra-op Plan:   Post-operative Plan: Extubation in OR  Informed Consent: I have reviewed the patients History and Physical, chart, labs and discussed the procedure including the risks, benefits and alternatives for the proposed anesthesia with the patient or authorized representative who has indicated his/her understanding and acceptance.   Dental advisory given  Plan Discussed with: CRNA and Surgeon  Anesthesia Plan Comments:         Anesthesia Quick Evaluation

## 2013-09-08 ENCOUNTER — Encounter (HOSPITAL_COMMUNITY): Payer: Self-pay | Admitting: Orthopaedic Surgery

## 2013-09-08 LAB — BASIC METABOLIC PANEL
BUN: 13 mg/dL (ref 6–23)
CALCIUM: 8.8 mg/dL (ref 8.4–10.5)
CO2: 25 mEq/L (ref 19–32)
Chloride: 99 mEq/L (ref 96–112)
Creatinine, Ser: 0.67 mg/dL (ref 0.50–1.10)
GFR, EST NON AFRICAN AMERICAN: 88 mL/min — AB (ref >90)
Glucose, Bld: 125 mg/dL — ABNORMAL HIGH (ref 70–99)
POTASSIUM: 3.8 meq/L (ref 3.7–5.3)
Sodium: 134 mEq/L — ABNORMAL LOW (ref 137–147)

## 2013-09-08 LAB — CBC
HEMATOCRIT: 32.8 % — AB (ref 36.0–46.0)
HEMOGLOBIN: 11.1 g/dL — AB (ref 12.0–15.0)
MCH: 30.1 pg (ref 26.0–34.0)
MCHC: 33.8 g/dL (ref 30.0–36.0)
MCV: 88.9 fL (ref 78.0–100.0)
Platelets: 179 10*3/uL (ref 150–400)
RBC: 3.69 MIL/uL — ABNORMAL LOW (ref 3.87–5.11)
RDW: 12.3 % (ref 11.5–15.5)
WBC: 8 10*3/uL (ref 4.0–10.5)

## 2013-09-08 LAB — GLUCOSE, CAPILLARY
Glucose-Capillary: 154 mg/dL — ABNORMAL HIGH (ref 70–99)
Glucose-Capillary: 134 mg/dL — ABNORMAL HIGH (ref 70–99)
Glucose-Capillary: 140 mg/dL — ABNORMAL HIGH (ref 70–99)

## 2013-09-08 MED ORDER — METHOCARBAMOL 500 MG PO TABS
500.0000 mg | ORAL_TABLET | Freq: Four times a day (QID) | ORAL | Status: DC | PRN
  Administered 2013-09-08 – 2013-09-09 (×3): 500 mg via ORAL
  Filled 2013-09-08 (×3): qty 1

## 2013-09-08 MED ORDER — ACETAMINOPHEN 325 MG PO TABS
650.0000 mg | ORAL_TABLET | ORAL | Status: DC | PRN
  Administered 2013-09-08 – 2013-09-09 (×3): 650 mg via ORAL
  Filled 2013-09-08 (×3): qty 2

## 2013-09-08 MED ORDER — ACETAMINOPHEN 650 MG RE SUPP: 650.0000 mg | Freq: Four times a day (QID) | RECTAL | Status: DC | PRN

## 2013-09-08 MED ORDER — INSULIN ASPART 100 UNIT/ML ~~LOC~~ SOLN
0.0000 [IU] | Freq: Three times a day (TID) | SUBCUTANEOUS | Status: DC
  Administered 2013-09-08: 1 [IU] via SUBCUTANEOUS
  Administered 2013-09-08: 2 [IU] via SUBCUTANEOUS
  Administered 2013-09-09: 1 [IU] via SUBCUTANEOUS

## 2013-09-08 NOTE — Progress Notes (Signed)
OT Cancellation Note  Patient Details Name: Rhonda Gomez MRN: [id number] DOB: 07/09/1946   Cancelled Treatment:    Reason Eval/Treat Not Completed:  Pt with plans to d/c to SNF.  Will defer OT to SNF.  Evern BioMayberry, Bryse Blanchette Lynn 09/08/2013, 10:34 AM [phone]

## 2013-09-08 NOTE — Plan of Care (Signed)
Problem: Phase I Progression Outcomes Goal: Dangle or out of bed evening of surgery Outcome: Completed/Met Date Met:  09/08/13 In early am

## 2013-09-08 NOTE — Evaluation (Signed)
Physical Therapy Evaluation Patient Details Name: Rhonda Gomez MRN: [id number] DOB: 1946/01/05 Today's Date: 09/08/2013 Time: [id number] PT Time Calculation (min): 30 min  PT Assessment / Plan / Recommendation History of Present Illness  Pt is s/p elective Rt TKA   Clinical Impression  Pt is s/p Rt TKA resulting in the deficits listed below (see PT Problem List). Pt will benefit from skilled PT to increase their independence and safety with mobility to allow discharge to the venue listed below. Pt required max encouragement and cues due to anxiety and pain to increase ambulation. Pt plans to D/C to SNF due to lack of 24/7 (A) and secondary to 14 steps to get to bedroom/bathroom.      PT Assessment  Patient needs continued PT services    Follow Up Recommendations  SNF;Supervision/Assistance - 24 hour    Does the patient have the potential to tolerate intense rehabilitation      Barriers to Discharge Inaccessible home environment;Decreased caregiver support      Equipment Recommendations  Rolling walker with 5" wheels;3in1 (PT)    Recommendations for Other Services OT consult   Frequency 7X/week    Precautions / Restrictions Precautions Precautions: Fall Required Braces or Orthoses: Knee Immobilizer - Right Knee Immobilizer - Right: On when out of bed or walking Restrictions Weight Bearing Restrictions: Yes RLE Weight Bearing: Weight bearing as tolerated (RLE)   Pertinent Vitals/Pain 8/10; premedicated with IV morphine by RN       Mobility  Bed Mobility Overal bed mobility: Needs Assistance Bed Mobility: Supine to Sit Supine to sit: Mod assist;HOB elevated General bed mobility comments: (A) to bring Rt LE to/off EOB; cues for hand placement and sequencing; incr time due to anxiety and pain Transfers Overall transfer level: Needs assistance Equipment used: Ambulation equipment used;Rolling walker (2 wheeled) Transfers: Sit to/from Stand Sit to Stand: Min  assist;From elevated surface General transfer comment: (A) to maintain balance and achieve upright standing position;  cues for hand placement and sequencing  Ambulation/Gait Ambulation/Gait assistance: Min guard Ambulation Distance (Feet): 30 Feet Assistive device: Rolling walker (2 wheeled) Gait Pattern/deviations: Decreased stance time - right;Decreased step length - left;Antalgic Gait velocity: decreased Gait velocity interpretation: Below normal speed for age/gender General Gait Details: cues for gt sequencing and RW management; min guard to maintain balance     Exercises Total Joint Exercises Ankle Circles/Pumps: AROM;Both;10 reps;Supine Quad Sets: AROM;Right;10 reps;Strengthening;Seated Heel Slides: AAROM;Right;10 reps;Seated Goniometric ROM: see R LE assessment   PT Diagnosis: Generalized weakness;Abnormality of gait;Acute pain  PT Problem List: Decreased strength;Decreased range of motion;Decreased activity tolerance;Decreased balance;Decreased mobility;Decreased cognition;Decreased knowledge of use of DME;Decreased safety awareness;Decreased knowledge of precautions;Pain PT Treatment Interventions: DME instruction;Gait training;Functional mobility training;Stair training;Therapeutic activities;Therapeutic exercise;Balance training;Neuromuscular re-education;Patient/family education     PT Goals(Current goals can be found in the care plan section) Acute Rehab PT Goals Patient Stated Goal: to go to rehab then home PT Goal Formulation: With patient Time For Goal Achievement: 09/15/13 Potential to Achieve Goals: Good  Visit Information  Last PT Received On: 09/08/13 Assistance Needed: +1 History of Present Illness: Pt is s/p elective Rt TKA        Prior Functioning  Home Living Family/patient expects to be discharged to:: Private residence Living Arrangements: Non-relatives/Friends Available Help at Discharge: Available 24 hours/day Additional Comments: Pt plans to D/C  To SNF due to lack of 24/7 care and due to 14 Steps inside house Prior Function Level of Independence: Independent Communication Communication: No difficulties Dominant Hand: Right  Cognition  Cognition Arousal/Alertness: Awake/alert Behavior During Therapy: Anxious Overall Cognitive Status: Within Functional Limits for tasks assessed    Extremity/Trunk Assessment Upper Extremity Assessment Upper Extremity Assessment: Defer to OT evaluation Lower Extremity Assessment Lower Extremity Assessment: RLE deficits/detail RLE Deficits / Details: generalized weakness; knee AAROM 2 degrees to 60 degress in chair  RLE: Unable to fully assess due to pain Cervical / Trunk Assessment Cervical / Trunk Assessment: Normal   Balance Balance Overall balance assessment: Needs assistance Sitting-balance support: Feet supported;No upper extremity supported Sitting balance-Leahy Scale: Good Standing balance support: During functional activity;Bilateral upper extremity supported Standing balance-Leahy Scale: Fair  End of Session PT - End of Session Equipment Utilized During Treatment: Gait belt;Right knee immobilizer Activity Tolerance: Patient tolerated treatment well Patient left: in chair;with call bell/phone within reach Nurse Communication: Mobility status;Patient requests pain meds;Precautions;Weight bearing status CPM Right Knee CPM Right Knee: [address]  GP     Rhonda Gomez, [place] [phone] 09/08/2013, 9:10 AM

## 2013-09-08 NOTE — Care Management Note (Signed)
CARE MANAGEMENT NOTE 09/08/2013  Patient:  Rhonda Gomez,Rhonda Gomez   Account Number:  [id number]  Date Initiated:  09/08/2013  Documentation initiated by:  Vance PeperBRADY,Annissa Andreoni  Subjective/Objective Assessment:   68 yr old female s/p right total knee arthroplasty.     Action/Plan:   CM spoke with patient concerning  needs at discharge. Patient states she lives alone and has [facility]. Social worker notified.   Anticipated DC Date:  09/10/2013   Anticipated DC Plan:  SKILLED NURSING FACILITY  In-house referral  Clinical Social Worker      DC Planning Services  CM consult      [facility]  [facility]   Choice offered to / List presented to:             Status of service:  Completed, signed off Medicare Important Message given?   (If response is "NO", the following Medicare IM given date fields will be blank) Date Medicare IM given:   Date Additional Medicare IM given:    Discharge Disposition:  SKILLED NURSING FACILITY  Per UR Regulation:    If discussed at Long Length of Stay Meetings, dates discussed:    Comments:

## 2013-09-08 NOTE — Progress Notes (Signed)
Physical Therapy Treatment Patient Details Name: Rhonda Gomez MRN: [id number] DOB: Aug 19, 1946 Today's Date: 09/08/2013 Time: [id number] PT Time Calculation (min): 24 min  PT Assessment / Plan / Recommendation  History of Present Illness Pt is s/p elective Rt TKA    PT Comments   Patient needing to walk to restroom upon entering room. Patient able to increase ambulation this afternoon. Held therex due to increase pain. Patient planning to DC to SNF on Friday. Will continue with current POC.   Follow Up Recommendations        Does the patient have the potential to tolerate intense rehabilitation     Barriers to Discharge        Equipment Recommendations  Rolling walker with 5" wheels;3in1 (PT)    Recommendations for Other Services    Frequency 7X/week   Progress towards PT Goals Progress towards PT goals: Progressing toward goals  Plan Current plan remains appropriate    Precautions / Restrictions Precautions Precautions: Fall Required Braces or Orthoses: Knee Immobilizer - Right Knee Immobilizer - Right: On when out of bed or walking Restrictions RLE Weight Bearing: Weight bearing as tolerated   Pertinent Vitals/Pain 8/10 R knee pain. RN aware    Mobility  Transfers Overall transfer level: Needs assistance Equipment used: Ambulation equipment used;Rolling walker (2 wheeled) Sit to Stand: Min assist General transfer comment: A to ensure balance with standing. Cues for safe hand placement Ambulation/Gait Ambulation/Gait assistance: Min guard Ambulation Distance (Feet): 50 Feet Assistive device: Rolling walker (2 wheeled) Gait velocity: decreased General Gait Details: Cues for gait sequence. Patient abel to increase distance with one standing rest break    Exercises     PT Diagnosis:    PT Problem List:   PT Treatment Interventions:     PT Goals (current goals can now be found in the care plan section)    Visit Information  Last PT Received On:  09/08/13 Assistance Needed: +1 History of Present Illness: Pt is s/p elective Rt TKA     Subjective Data      Cognition  Cognition Arousal/Alertness: Awake/alert Behavior During Therapy: WFL for tasks assessed/performed Overall Cognitive Status: Within Functional Limits for tasks assessed    Balance     End of Session PT - End of Session Equipment Utilized During Treatment: Gait belt;Right knee immobilizer Activity Tolerance: Patient tolerated treatment well Patient left: in chair;with call bell/phone within reach Nurse Communication: Mobility status;Patient requests pain meds   GP     Fredrich BirksRobinette, Jaselyn Nahm Elizabeth 09/08/2013, 2:24 PM 09/08/2013 Fredrich Birksobinette, La Dibella Elizabeth PTA [phone] pager [phone] office

## 2013-09-08 NOTE — Progress Notes (Signed)
Subjective: 1 Day Post-Op Procedure(s) (LRB): TOTAL KNEE ARTHROPLASTY (Right) Complaining of pain. Medicine helping somewhat plan is to discharge to skilled nursing facility Thursday or Friday. Activity level:  Weightbearing as tolerated Diet tolerance:  ok Voiding:  ok Patient reports pain as 7 on 0-10 scale.    Objective: Vital signs in last 24 hours: Temp:  [97.1 F (36.2 C)-99.5 F (37.5 C)] 99.3 F (37.4 C) (01/14 0801) Pulse Rate:  [63-86] 73 (01/14 0150) Resp:  [10-18] 18 (01/14 0400) BP: (110-149)/(59-89) 111/59 mmHg (01/14 0150) SpO2:  [95 %-100 %] 95 % (01/14 0150)  Labs:  Recent Labs  09/08/13 0551  HGB 11.1*    Recent Labs  09/08/13 0551  WBC 8.0  RBC 3.69*  HCT 32.8*  PLT 179    Recent Labs  09/08/13 0551  NA 134*  K 3.8  CL 99  CO2 25  BUN 13  CREATININE 0.67  GLUCOSE 125*  CALCIUM 8.8   No results found for this basename: LABPT, INR,  in the last 72 hours  Physical Exam:  Neurologically intact ABD soft Neurovascular intact Sensation intact distally Intact pulses distally Dorsiflexion/Plantar flexion intact Incision: dressing C/D/I No cellulitis present Compartment soft  Assessment/Plan:  1 Day Post-Op Procedure(s) (LRB): TOTAL KNEE ARTHROPLASTY (Right) Continue current pain medication. Work with therapy discharge skilled nursing facility Thursday or Friday ASA 3251 pill twice a day x2 weeks. And incentive spirometry, SCDs We will change dressing later today.    Raymondo Garcialopez R 09/08/2013, 8:44 AM

## 2013-09-09 LAB — GLUCOSE, CAPILLARY
Glucose-Capillary: 120 mg/dL — ABNORMAL HIGH (ref 70–99)
Glucose-Capillary: 113 mg/dL — ABNORMAL HIGH (ref 70–99)
Glucose-Capillary: 143 mg/dL — ABNORMAL HIGH (ref 70–99)

## 2013-09-09 LAB — CBC
HCT: 31.3 % — ABNORMAL LOW (ref 36.0–46.0)
Hemoglobin: 10.6 g/dL — ABNORMAL LOW (ref 12.0–15.0)
MCH: 29.9 pg (ref 26.0–34.0)
MCHC: 33.9 g/dL (ref 30.0–36.0)
MCV: 88.2 fL (ref 78.0–100.0)
Platelets: 188 10*3/uL (ref 150–400)
RBC: 3.55 MIL/uL — ABNORMAL LOW (ref 3.87–5.11)
RDW: 12 % (ref 11.5–15.5)
WBC: 10.4 10*3/uL (ref 4.0–10.5)

## 2013-09-09 MED ORDER — ACETAMINOPHEN 325 MG PO TABS: 650.0000 mg | ORAL_TABLET | ORAL | Status: AC | PRN

## 2013-09-09 MED ORDER — TRAMADOL HCL 50 MG PO TABS: 50.0000 mg | ORAL_TABLET | Freq: Four times a day (QID) | ORAL | Status: AC | PRN

## 2013-09-09 MED ORDER — ASPIRIN 325 MG PO TBEC: 325.0000 mg | DELAYED_RELEASE_TABLET | Freq: Two times a day (BID) | ORAL | Status: AC

## 2013-09-09 MED ORDER — METHOCARBAMOL 500 MG PO TABS: 500.0000 mg | ORAL_TABLET | Freq: Four times a day (QID) | ORAL | Status: AC | PRN

## 2013-09-09 NOTE — Progress Notes (Signed)
Physical Therapy Treatment Patient Details Name: Rhonda Gomez MRN: [id number] DOB: [birth year] Today's Date: 09/09/2013 Time: 1345-1410 PT Time Calculation (min): 25 min  PT Assessment / Plan / Recommendation  History of Present Illness Pt is s/p elective Rt TKA    PT Comments   Progressing well. Ambulated without knee immobilizer without evidence of buckling. Continue with current POC  Follow Up Recommendations  SNF;Supervision/Assistance - 24 hour     Does the patient have the potential to tolerate intense rehabilitation     Barriers to Discharge        Equipment Recommendations  Rolling walker with 5" wheels;3in1 (PT)    Recommendations for Other Services    Frequency 7X/week   Progress towards PT Goals Progress towards PT goals: Progressing toward goals  Plan Current plan remains appropriate    Precautions / Restrictions Precautions Required Braces or Orthoses: Knee Immobilizer - Right Knee Immobilizer - Right: On when out of bed or walking Restrictions RLE Weight Bearing: Weight bearing as tolerated   Pertinent Vitals/Pain no apparent distress    Mobility  Transfers Overall transfer level: Needs assistance Equipment used: Ambulation equipment used;Rolling walker (2 wheeled) Sit to Stand: Min guard General transfer comment: Cues for safe hand placement Ambulation/Gait Ambulation/Gait assistance: Min guard Ambulation Distance (Feet): 120 Feet Assistive device: Rolling walker (2 wheeled) Gait velocity: decreased General Gait Details:  increased weight through LEs    Exercises Total Joint Exercises Quad Sets: AROM;Right;10 reps;Strengthening;Seated Heel Slides: AAROM;Right;10 reps;Seated Hip ABduction/ADduction: AAROM;Right;10 reps;Seated Straight Leg Raises: AAROM;Right;10 reps;Seated   PT Diagnosis:    PT Problem List:   PT Treatment Interventions:     PT Goals (current goals can now be found in the care plan section)    Visit Information  Last  PT Received On: 09/09/13 Assistance Needed: +1 History of Present Illness: Pt is s/p elective Rt TKA     Subjective Data      Cognition  Cognition Arousal/Alertness: Awake/alert Behavior During Therapy: WFL for tasks assessed/performed Overall Cognitive Status: Within Functional Limits for tasks assessed    Balance     End of Session PT - End of Session Equipment Utilized During Treatment: Gait belt Activity Tolerance: Patient tolerated treatment well Patient left: in chair;with call bell/phone within reach Nurse Communication: Mobility status   GP     Fredrich BirksRobinette, Julia Elizabeth 09/09/2013, 2:48 PM 09/09/2013 Fredrich Birksobinette, Julia Elizabeth PTA [phone] pager [phone] office

## 2013-09-09 NOTE — Discharge Summary (Signed)
Patient ID: Rhonda Gomez MRN: [id number] DOB/AGE: 02/14/1946 68 y.o.  Admit date: 09/07/2013 Discharge date: 09/09/2013  Admission Diagnoses:  Active Problems:   Total knee replacement status, right   Discharge Diagnoses:  Same  Past Medical History  Diagnosis Date  . PONV (postoperative nausea and vomiting)   . Hypertension   . Diabetes mellitus without complication   . GERD (gastroesophageal reflux disease)   . Hyperlipidemia     Surgeries: Procedure(s): TOTAL KNEE ARTHROPLASTY on 09/07/2013   Consultants:    Discharged Condition: Improved  Hospital Course: Rhonda Gomez is an 68 y.o. female who was admitted 09/07/2013 for operative treatment of<principal problem not specified>. Patient has severe unremitting pain that affects sleep, daily activities, and work/hobbies. After pre-op clearance the patient was taken to the operating room on 09/07/2013 and underwent  Procedure(s): TOTAL KNEE ARTHROPLASTY.    Patient was given perioperative antibiotics: Anti-infectives   Start     Dose/Rate Route Frequency Ordered Stop   09/07/13 1700  ceFAZolin (ANCEF) IVPB 2 g/50 mL premix     2 g 100 mL/hr over 30 Minutes Intravenous Every 6 hours 09/07/13 1559 09/07/13 2318   09/07/13 0600  ceFAZolin (ANCEF) IVPB 2 g/50 mL premix     2 g 100 mL/hr over 30 Minutes Intravenous On call to O.R. 09/06/13 1413 09/07/13 1009       Patient was given sequential compression devices, early ambulation, and chemoprophylaxis to prevent DVT.  Patient benefited maximally from hospital stay and there were no complications.    Recent vital signs: Patient Vitals for the past 24 hrs:  BP Temp Temp src Pulse Resp SpO2  09/09/13 1300 138/67 mmHg 98.2 F (36.8 C) Oral - 18 94 %  09/09/13 1024 - 99.2 F (37.3 C) Oral - - -  09/09/13 0800 - - - - 16 -  09/09/13 0439 137/70 mmHg 100 F (37.8 C) Oral 91 18 94 %  09/09/13 0000 - - - - 18 -  09/08/13 2046 130/72 mmHg 100 F (37.8 C) Oral 97 18  96 %  09/08/13 1743 - 98.8 F (37.1 C) Oral - - -  09/08/13 1600 - - - - 18 -     Recent laboratory studies:  Recent Labs  09/08/13 0551 09/09/13 0300  WBC 8.0 10.4  HGB 11.1* 10.6*  HCT 32.8* 31.3*  PLT 179 188  NA 134*  --   K 3.8  --   CL 99  --   CO2 25  --   BUN 13  --   CREATININE 0.67  --   GLUCOSE 125*  --   CALCIUM 8.8  --      Discharge Medications:     Medication List    STOP taking these medications       aspirin 81 MG tablet  Replaced by:  aspirin 325 MG EC tablet     nabumetone 750 MG tablet  Commonly known as:  RELAFEN      TAKE these medications       acetaminophen 325 MG tablet  Commonly known as:  TYLENOL  Take 2 tablets (650 mg total) by mouth every 4 (four) hours as needed for mild pain (or Fever >/= 101).     aspirin 325 MG EC tablet  Take 1 tablet (325 mg total) by mouth 2 (two) times daily after a meal.     cetirizine 10 MG tablet  Commonly known as:  ZYRTEC  Take 10 mg by mouth every  morning.     estradiol 0.5 MG tablet  Commonly known as:  ESTRACE  Take 0.5 mg by mouth daily.     famotidine 40 MG tablet  Commonly known as:  PEPCID  Take 40 mg by mouth at bedtime.     fenofibrate 160 MG tablet  Take 160 mg by mouth daily.     fluticasone 50 MCG/ACT nasal spray  Commonly known as:  FLONASE  Place 2 sprays into both nostrils daily.     GRAPE SEED CR PO  Take 1 tablet by mouth 4 (four) times daily.     hydrochlorothiazide 25 MG tablet  Commonly known as:  HYDRODIURIL  Take 25 mg by mouth daily.     loratadine 10 MG tablet  Commonly known as:  CLARITIN  Take 10 mg by mouth at bedtime.     losartan 100 MG tablet  Commonly known as:  COZAAR  Take 100 mg by mouth daily.     medroxyPROGESTERone 2.5 MG tablet  Commonly known as:  PROVERA  Take 2.5 mg by mouth daily.     metFORMIN 500 MG tablet  Commonly known as:  GLUCOPHAGE  Take 500 mg by mouth 2 (two) times daily with a meal.     methocarbamol 500 MG tablet   Commonly known as:  ROBAXIN  Take 1 tablet (500 mg total) by mouth every 6 (six) hours as needed for muscle spasms (spasms).     simvastatin 20 MG tablet  Commonly known as:  ZOCOR  Take 20 mg by mouth at bedtime.     traMADol 50 MG tablet  Commonly known as:  ULTRAM  Take 1-2 tablets (50-100 mg total) by mouth every 6 (six) hours as needed (50-100mg  PRN mild to moderate pain).       May change dressing as needed. CPM 0-65 and advance as tolerated.  Diagnostic Studies: Dg Chest 2 View  09/02/2013   CLINICAL DATA:  Preop for total knee arthroplasty.  EXAM: CHEST  2 VIEW  COMPARISON:  None.  FINDINGS: The heart size and mediastinal contours are within normal limits. Both lungs are clear. There is mild spondylosis of the spine.  IMPRESSION: No active cardiopulmonary disease.   Electronically Signed   By: Elberta Fortisaniel  Boyle M.D.   On: 09/02/2013 13:53    Disposition: Final discharge disposition not confirmed      Discharge Orders   Future Orders Complete By Expires   Call MD / Call 911  As directed    Comments:     If you experience chest pain or shortness of breath, CALL 911 and be transported to the hospital emergency room.  If you develope a fever above 101 F, pus (white drainage) or increased drainage or redness at the wound, or calf pain, call your surgeon's office.   Constipation Prevention  As directed    Comments:     Drink plenty of fluids.  Prune juice may be helpful.  You may use a stool softener, such as Colace (over the counter) 100 mg twice a day.  Use MiraLax (over the counter) for constipation as needed.   Diet - low sodium heart healthy  As directed    Increase activity slowly as tolerated  As directed       Follow-up Information   Follow up with DALLDORF,PETER G, MD. Call in 2 weeks.   Specialty:  Orthopedic Surgery   Contact information:   [address] [place] [address] [phone]  Signed: Prince Rome 09/09/2013, 2:53 PM

## 2013-09-09 NOTE — Discharge Instructions (Signed)
Continue ice and elevation. Change dressing when necessary. Continue ASA 3251 pill twice a day x2 weeks. Return to office in 2 weeks. Weightbearing as tolerated

## 2013-09-09 NOTE — Progress Notes (Signed)
Physical Therapy Treatment Patient Details Name: Rhonda Gomez MRN: [id number] DOB: 04/19/1946 Today's Date: 09/09/2013 Time: 0810-0840 PT Time Calculation (min): 30 min  PT Assessment / Plan / Recommendation  History of Present Illness Pt is s/p elective Rt TKA    PT Comments   Patient progressing well with ambulation this session. Continues to be limited with knee flexion. Continue current POC  Follow Up Recommendations  SNF;Supervision/Assistance - 24 hour     Does the patient have the potential to tolerate intense rehabilitation     Barriers to Discharge        Equipment Recommendations  Rolling walker with 5" wheels;3in1 (PT)    Recommendations for Other Services    Frequency 7X/week   Progress towards PT Goals Progress towards PT goals: Progressing toward goals  Plan Current plan remains appropriate    Precautions / Restrictions Precautions Precautions: Fall Required Braces or Orthoses: Knee Immobilizer - Right Knee Immobilizer - Right: On when out of bed or walking Restrictions RLE Weight Bearing: Weight bearing as tolerated   Pertinent Vitals/Pain 5/10 R knee pain. patient repositioned for comfort     Mobility  Transfers Overall transfer level: Needs assistance Equipment used: Ambulation equipment used;Rolling walker (2 wheeled) Sit to Stand: Min guard General transfer comment: Cues for safe hand placement Ambulation/Gait Ambulation/Gait assistance: Min guard Ambulation Distance (Feet): 90 Feet Assistive device: Rolling walker (2 wheeled) Gait Pattern/deviations: Step-to pattern;Decreased stance time - right;Decreased step length - left Gait velocity: decreased General Gait Details: Cues for steppage and to increase weight through LEs    Exercises Total Joint Exercises Quad Sets: AROM;Right;10 reps;Strengthening;Seated Heel Slides: AAROM;Right;10 reps;Seated Hip ABduction/ADduction: AAROM;Right;10 reps;Seated Straight Leg Raises: AAROM;Right;10  reps;Seated   PT Diagnosis:    PT Problem List:   PT Treatment Interventions:     PT Goals (current goals can now be found in the care plan section)    Visit Information  Last PT Received On: 09/09/13 Assistance Needed: +1 History of Present Illness: Pt is s/p elective Rt TKA     Subjective Data      Cognition  Cognition Arousal/Alertness: Awake/alert Behavior During Therapy: WFL for tasks assessed/performed Overall Cognitive Status: Within Functional Limits for tasks assessed    Balance     End of Session PT - End of Session Equipment Utilized During Treatment: Gait belt;Right knee immobilizer Activity Tolerance: Patient tolerated treatment well Patient left: in chair;with call bell/phone within reach Nurse Communication: Mobility status   GP     Fredrich BirksRobinette, Rhonda Elizabeth 09/09/2013, 8:45 AM 09/09/2013 Fredrich Gomez, Rhonda Elizabeth PTA [phone] pager [phone] office

## 2013-09-09 NOTE — Progress Notes (Signed)
Subjective: 2 Days Post-Op Procedure(s) (LRB): TOTAL KNEE ARTHROPLASTY (Right) Did well with therapy today pain under control would like to go to SNF today.  Activity level:  Weightbearing as tolerated Diet tolerance:  ok Voiding:  ok Patient reports pain as 3 on 0-10 scale.    Objective: Vital signs in last 24 hours: Temp:  [98.2 F (36.8 C)-100 F (37.8 C)] 98.2 F (36.8 C) (01/15 1300) Pulse Rate:  [91-97] 91 (01/15 0439) Resp:  [16-18] 18 (01/15 1300) BP: (130-138)/(67-72) 138/67 mmHg (01/15 1300) SpO2:  [94 %-96 %] 94 % (01/15 1300)  Labs:  Recent Labs  09/08/13 0551 09/09/13 0300  HGB 11.1* 10.6*    Recent Labs  09/08/13 0551 09/09/13 0300  WBC 8.0 10.4  RBC 3.69* 3.55*  HCT 32.8* 31.3*  PLT 179 188    Recent Labs  09/08/13 0551  NA 134*  K 3.8  CL 99  CO2 25  BUN 13  CREATININE 0.67  GLUCOSE 125*  CALCIUM 8.8   No results found for this basename: LABPT, INR,  in the last 72 hours  Physical Exam:  Neurologically intact ABD soft Neurovascular intact Sensation intact distally Intact pulses distally Dorsiflexion/Plantar flexion intact No cellulitis present Compartment soft  Assessment/Plan:  2 Days Post-Op Procedure(s) (LRB): TOTAL KNEE ARTHROPLASTY (Right) Advance diet Up with therapy D/C IV fluids Discharge to SNF    Rhonda Gomez R 09/09/2013, 2:41 PM

## 2013-09-10 NOTE — Clinical Social Work Placement (Signed)
Clinical Social Work Department  CLINICAL SOCIAL WORK PLACEMENT NOTE  Patient:Rhonda E McHughAccount Number: [id number]  Admit date: 09/07/13  Clinical Social Worker: Sabino Niemannmy Aleene Swanner LCSWA Date/time: 09/07/2013 11:30 AM  Clinical Social Work is seeking post-discharge placement for this patient at the following level of care: SKILLED NURSING (*CSW will update this form in Epic as items are completed)  09/07/2013 [facility]family provided with [facility] of Clinical Social Work's list of facilities offering this level of care within the geographic area requested by the patient (or if unable, by the patient's family).  09/07/2013 Patient/family informed of their freedom to choose among providers that offer the needed level of care, that participate in Medicare, Medicaid or managed care program needed by the patient, have an available bed and are willing to accept the patient.  09/07/2013 Patient/family informed of [facility]' ownership interest in [facility], as well as of the fact that they are under no obligation to receive care at this facility.  PASARR submitted to EDS on  PASARR number received from EDS on  FL2 transmitted to all facilities in geographic area requested by pt/family on 09/07/2013  FL2 transmitted to all facilities within larger geographic area on  Patient informed that his/her managed care company has contracts with or will negotiate with certain facilities, including the following:  Patient/family informed of bed offers received:  [facility]  Physician recommends and patient chooses bed at  Patient to be transferred to on 09/09/13 PTAR  Patient to be transferred to facility by  The following physician request were entered in Epic:  Additional Comments:

## 2013-09-10 NOTE — Clinical Social Work Psychosocial (Signed)
Clinical Social Work Department  BRIEF PSYCHOSOCIAL ASSESSMENT  Patient:Rhonda Gomez  Account Number: [id number]   Admit date: 09/07/13 Clinical Social Worker Rhea Pink, MSW Date/Time: 09/07/13 2:00 PM Referred by: Physician Date Referred: 09/07/13 Referred for   SNF Placement   Other Referral:  Interview type: Patient  Other interview type: PSYCHOSOCIAL DATA  [place] Admitted from facility:  Level of care:  Primary support name: Kuhnle,John   Primary support relationship to patient: Brother Degree of support available:  Fair  CURRENT CONCERNS  Current Concerns   Post-Acute Placement   Other Concerns:  SOCIAL WORK ASSESSMENT / PLAN  CSW met with pt re: PT recommendation for SNF.   Pt lives alone  CSW explained placement process and answered questions.   Pt reports [facility]  as her preference    CSW completed FL2 and initiated SNF search.     Assessment/plan status: Information/Referral to [employer]  Other assessment/ plan:  Information/referral to community resources:  SNF     PATIENT'S/FAMILY'S RESPONSE TO PLAN OF CARE:  Pt  reports she is agreeable to ST SNF in order to increase strength and independence with mobility prior to returning home  Pt verbalized understanding of placement process and appreciation for CSW assist.   Rhea Pink, MSW, [place]

## 2013-09-10 NOTE — Progress Notes (Signed)
Clinical social worker assisted with patient discharge to skilled nursing facility, [facility].  CSW addressed all family questions and concerns. CSW copied chart and added all important documents. CSW also set up patient [facility]. Clinical Social Worker will sign off for now as social work intervention is no longer needed.   Sabino NiemannAmy Sanjuana Mruk, MSW, [employer]CSWA [phone]

## 2013-09-18 MED ORDER — ROPIVACAINE HCL 5 MG/ML IJ SOLN
INTRAMUSCULAR | Status: DC | PRN
  Administered 2013-09-07: 20 mL via PERINEURAL

## 2013-09-18 NOTE — Addendum Note (Signed)
Addendum created 09/18/13 0813 by Corky Soxhris Rishita Petron, MD   Modules edited: Anesthesia Blocks and Procedures, Anesthesia Medication Administration

## 2015-04-01 IMAGING — CR DG CHEST 2V
2 series · 2 of 2 positions shown · non-contrast
Comparison: None.

CLINICAL DATA: Preop for total knee arthroplasty.

EXAM:
CHEST  2 VIEW

[w chest pa]
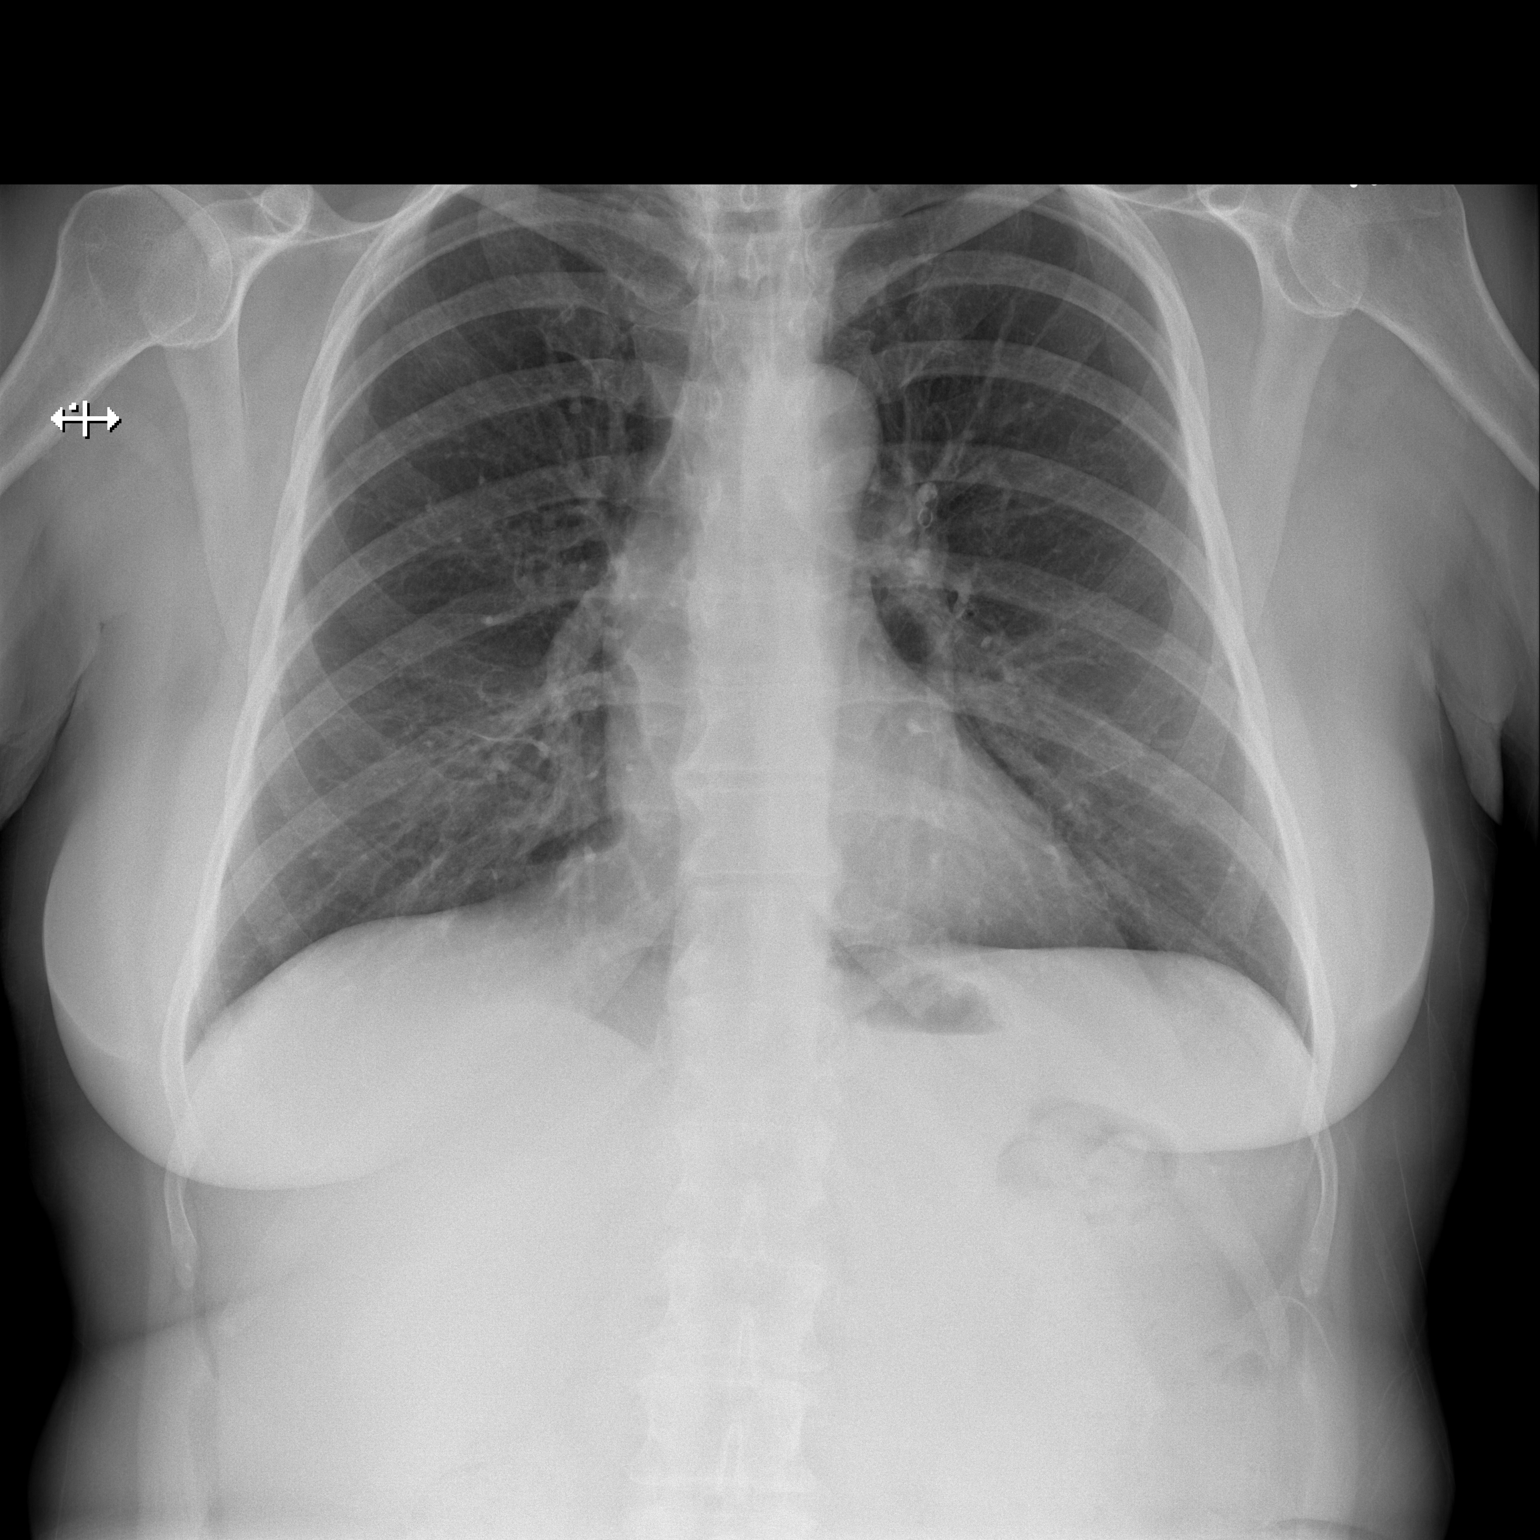

[w chest lat]
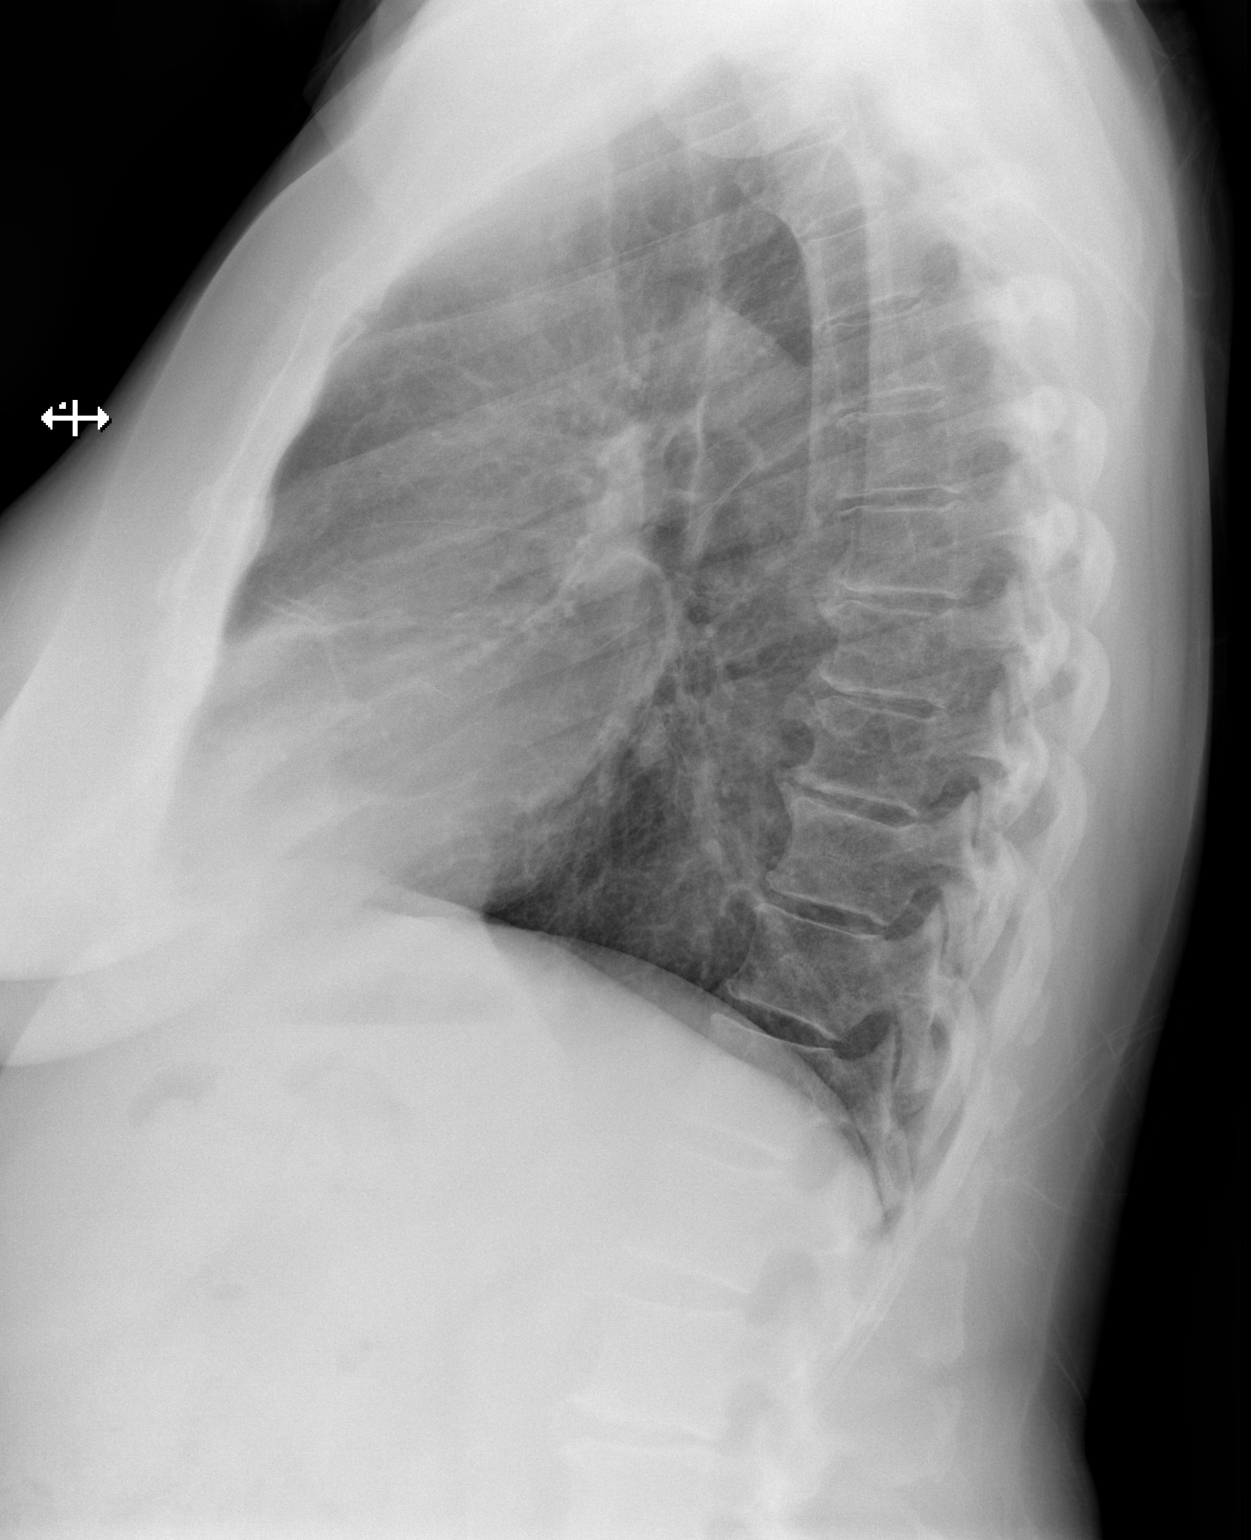

[2 of 2 positions shown; findings below may reference images not displayed]

FINDINGS: The heart size and mediastinal contours are within normal limits.
Both lungs are clear. There is mild spondylosis of the spine.
IMPRESSION: No active cardiopulmonary disease.

## 2024-09-01 ENCOUNTER — Other Ambulatory Visit: Payer: Self-pay

## 2024-09-01 ENCOUNTER — Other Ambulatory Visit (HOSPITAL_COMMUNITY): Payer: Self-pay

## 2024-09-01 MED ORDER — LOSARTAN POTASSIUM 100 MG PO TABS
100.0000 mg | ORAL_TABLET | Freq: Every day | ORAL | 1 refills | Status: AC
  Filled 2024-09-01: qty 90, 90d supply, fill #0

## 2024-09-01 MED ORDER — HYDROCHLOROTHIAZIDE 25 MG PO TABS
25.0000 mg | ORAL_TABLET | Freq: Every day | ORAL | 1 refills | Status: AC
  Filled 2024-09-01: qty 90, 90d supply, fill #0

## 2024-09-01 MED ORDER — ROSUVASTATIN CALCIUM 20 MG PO TABS
ORAL_TABLET | ORAL | 1 refills | Status: AC
  Filled 2024-09-01: qty 90, 90d supply, fill #0

## 2024-09-01 MED ORDER — METFORMIN HCL 500 MG PO TABS
ORAL_TABLET | ORAL | 2 refills | Status: AC
  Filled 2024-09-01: qty 180, 90d supply, fill #0

## 2024-09-01 MED ORDER — OMEPRAZOLE 20 MG PO CPDR
DELAYED_RELEASE_CAPSULE | ORAL | 1 refills | Status: AC
  Filled 2024-09-01: qty 90, 90d supply, fill #0
